# Patient Record
Sex: Female | Born: 1985 | Race: Black or African American | Hispanic: No | Marital: Single | Smoking: Light tobacco smoker
Health system: Southern US, Community
[De-identification: ages and names within clinical notes are randomized; demographics above are authoritative.]

---

## 2012-02-28 ENCOUNTER — Encounter (HOSPITAL_COMMUNITY): Payer: Self-pay | Admitting: Adult Health

## 2012-02-28 ENCOUNTER — Emergency Department (HOSPITAL_COMMUNITY): Payer: Self-pay

## 2012-02-28 ENCOUNTER — Emergency Department (HOSPITAL_COMMUNITY)
Admission: EM | Admit: 2012-02-28 | Discharge: 2012-02-28 | Disposition: A | Discharge status: Home / Self Care | Payer: Self-pay | Attending: Emergency Medicine | Admitting: Emergency Medicine

## 2012-02-28 DIAGNOSIS — J111 Influenza due to unidentified influenza virus with other respiratory manifestations: Secondary | ICD-10-CM | POA: Insufficient documentation

## 2012-02-28 DIAGNOSIS — F17200 Nicotine dependence, unspecified, uncomplicated: Secondary | ICD-10-CM | POA: Insufficient documentation

## 2012-02-28 DIAGNOSIS — R0989 Other specified symptoms and signs involving the circulatory and respiratory systems: Secondary | ICD-10-CM | POA: Insufficient documentation

## 2012-02-28 MED ORDER — OSELTAMIVIR PHOSPHATE 75 MG PO CAPS: 75.0000 mg | ORAL_CAPSULE | Freq: Two times a day (BID) | ORAL | Status: AC

## 2012-02-28 MED ORDER — OSELTAMIVIR PHOSPHATE 75 MG PO CAPS
75.0000 mg | ORAL_CAPSULE | Freq: Once | ORAL | Status: AC
  Administered 2012-02-28: 75 mg via ORAL
  Filled 2012-02-28: qty 1

## 2012-02-28 MED ORDER — SODIUM CHLORIDE 0.9 % IV BOLUS (SEPSIS)
1000.0000 mL | Freq: Once | INTRAVENOUS | Status: AC
  Administered 2012-02-28: 1000 mL via INTRAVENOUS

## 2012-02-28 MED ORDER — IBUPROFEN 400 MG PO TABS
800.0000 mg | ORAL_TABLET | Freq: Once | ORAL | Status: AC
  Administered 2012-02-28: 800 mg via ORAL
  Filled 2012-02-28: qty 2

## 2012-02-28 MED ORDER — ACETAMINOPHEN 325 MG PO TABS
650.0000 mg | ORAL_TABLET | Freq: Once | ORAL | Status: AC
  Administered 2012-02-28: 650 mg via ORAL
  Filled 2012-02-28: qty 2

## 2012-02-28 MED ORDER — ACETAMINOPHEN 325 MG PO TABS: 650.0000 mg | ORAL_TABLET | Freq: Once | ORAL | Status: DC

## 2012-02-28 NOTE — ED Provider Notes (Signed)
Medical screening examination/treatment/procedure(s) were performed by non-physician practitioner and as supervising physician I was immediately available for consultation/collaboration.    Celene Kras, MD 02/28/12 [phone]

## 2012-02-28 NOTE — ED Notes (Signed)
C/o HA onset 12.24 pm sudden onset associated with shaking chills, fever, radiates from top of head  down to posterior neck. Denies  Nausea, vomiting or photophobia.  Rates HA 7/10,constant unreleved by ibuprofen and robitussin.

## 2012-02-28 NOTE — ED Notes (Addendum)
Presents with 24 hours of chills and headache that begins in forehead and radiates to neck. Headache is described as severe and not associated with light and sound sensitivity. Denies SOB. Reports cough with congestion. Fever of 103.0 at triage. Denies rashes denies vomiting.  C/o generalized body aches.

## 2012-02-28 NOTE — ED Provider Notes (Signed)
  Physical Exam  BP 136/77  Pulse 102  Temp 102.3 F (39.1 C) (Oral)  Resp 20  SpO2 98%  LMP 02/28/2012  Physical Exam temperature spikes to 104.5 despite tylenol and Ibuprofen  ED Course  Procedures  MDM Administered first dose of Tamiflu and 1 Liter fluids patient feels better       Arman Filter, NP 02/28/12 2143

## 2012-02-28 NOTE — ED Provider Notes (Signed)
History  This chart was scribed for Gwyneth Sprout, MD by Shari Heritage, ED Scribe. The patient was seen in room TR07C/TR07C. Patient's care was started at 1826.  CSN: [id number]  Arrival date & time 02/28/12  1826   Chief Complaint  Patient presents with  . Headache     The history is provided by the patient. No language interpreter was used.    HPI Comments: Barbara Roberson is a 26 y.o. female who presents to the Emergency Department complaining of constant, moderate to severe, dull headache that radiates to her neck and chills onset yesterday. There is associated mild, infrequent nonproductive cough and chest congestion. Patient also reports some mild right shin pain. Patient denies sore throat, rhinorrhea or shortness of breath. Patient took Ibuprofen and Robitussin 9 hours ago. 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 She lives with her uncle but says he is not having any symptoms. Patient did not have a flu shot this year. She has no significant medical or surgical history.  History reviewed. No pertinent family history.  History  Substance Use Topics  . Smoking status: Light Tobacco Smoker  . Smokeless tobacco: Not on file  . Alcohol Use: No    OB History    Grav Para Term Preterm Abortions TAB SAB Ect Mult Living                  Review of Systems A complete 10 system review of systems was obtained and all systems are negative except as noted in the HPI and PMH.   Allergies  Review of patient's allergies indicates no known allergies.  Home Medications  No current outpatient prescriptions on file.  Triage Vitals: BP 145/73  Pulse 107  Temp 103 F (39.4 C) (Oral)  Resp 18  SpO2 99%  Physical Exam  Constitutional: She is oriented to person, place, and time. She appears well-developed and well-nourished. No distress.  HENT:  Head: Normocephalic and atraumatic.  Right Ear: Tympanic membrane and external ear normal.  Left Ear: Tympanic  membrane and external ear normal.  Mouth/Throat: Oropharynx is clear and moist.  Eyes: EOM are normal.  Neck: Neck supple.  Cardiovascular: Normal rate and regular rhythm.   No murmur heard. Pulmonary/Chest: Effort normal and breath sounds normal. No respiratory distress. She has no wheezes. She has no rales.  Abdominal: She exhibits no distension.  Musculoskeletal: Normal range of motion. She exhibits no edema and no tenderness.  Lymphadenopathy:    She has no cervical adenopathy.  Neurological: She is alert and oriented to person, place, and time.  Skin: Skin is warm and dry. No rash noted.  Psychiatric: She has a normal mood and affect. Her behavior is normal.    ED Course  Procedures (including critical care time) DIAGNOSTIC STUDIES: Oxygen Saturation is 99% on room air, normal by my interpretation.    COORDINATION OF CARE: 6:55 PM- Patient informed of current plan for treatment and evaluation and agrees with plan at this time.      Labs Reviewed - No data to display Dg Chest 2 View  02/28/2012  *RADIOLOGY REPORT*  Clinical Data: Fever and cough  CHEST - 2 VIEW  Comparison: None.  Findings: There is no edema or consolidation.  Heart size and pulmonary vascularity are normal.  No adenopathy.  There is upper thoracic levoscoliosis with mid thoracic dextroscoliosis.  IMPRESSION: Scoliosis.  No edema or consolidation.   Original Report Authenticated By: Bretta Bang, M.D.  No diagnosis found.    MDM   Pt with symptoms consistent with influenza.  Normal exam here but is febrile to 103.  No signs of breathing difficulty  No signs of strep pharyngitis, otitis or abnormal abdominal findings.   CXR wnl. Tamiflu given. Will continue antipyretica and rest and fluids and return for any further problems.     I personally performed the services described in this documentation, which was scribed in my presence.  The recorded information has been reviewed and  considered.   Gwyneth Sprout, MD 02/28/12 1949

## 2014-04-26 IMAGING — CR DG CHEST 2V
2 series · 2 of 2 positions shown · non-contrast
Comparison: None.

CLINICAL DATA: Fever and cough

CHEST - 2 VIEW

[w chest pa]
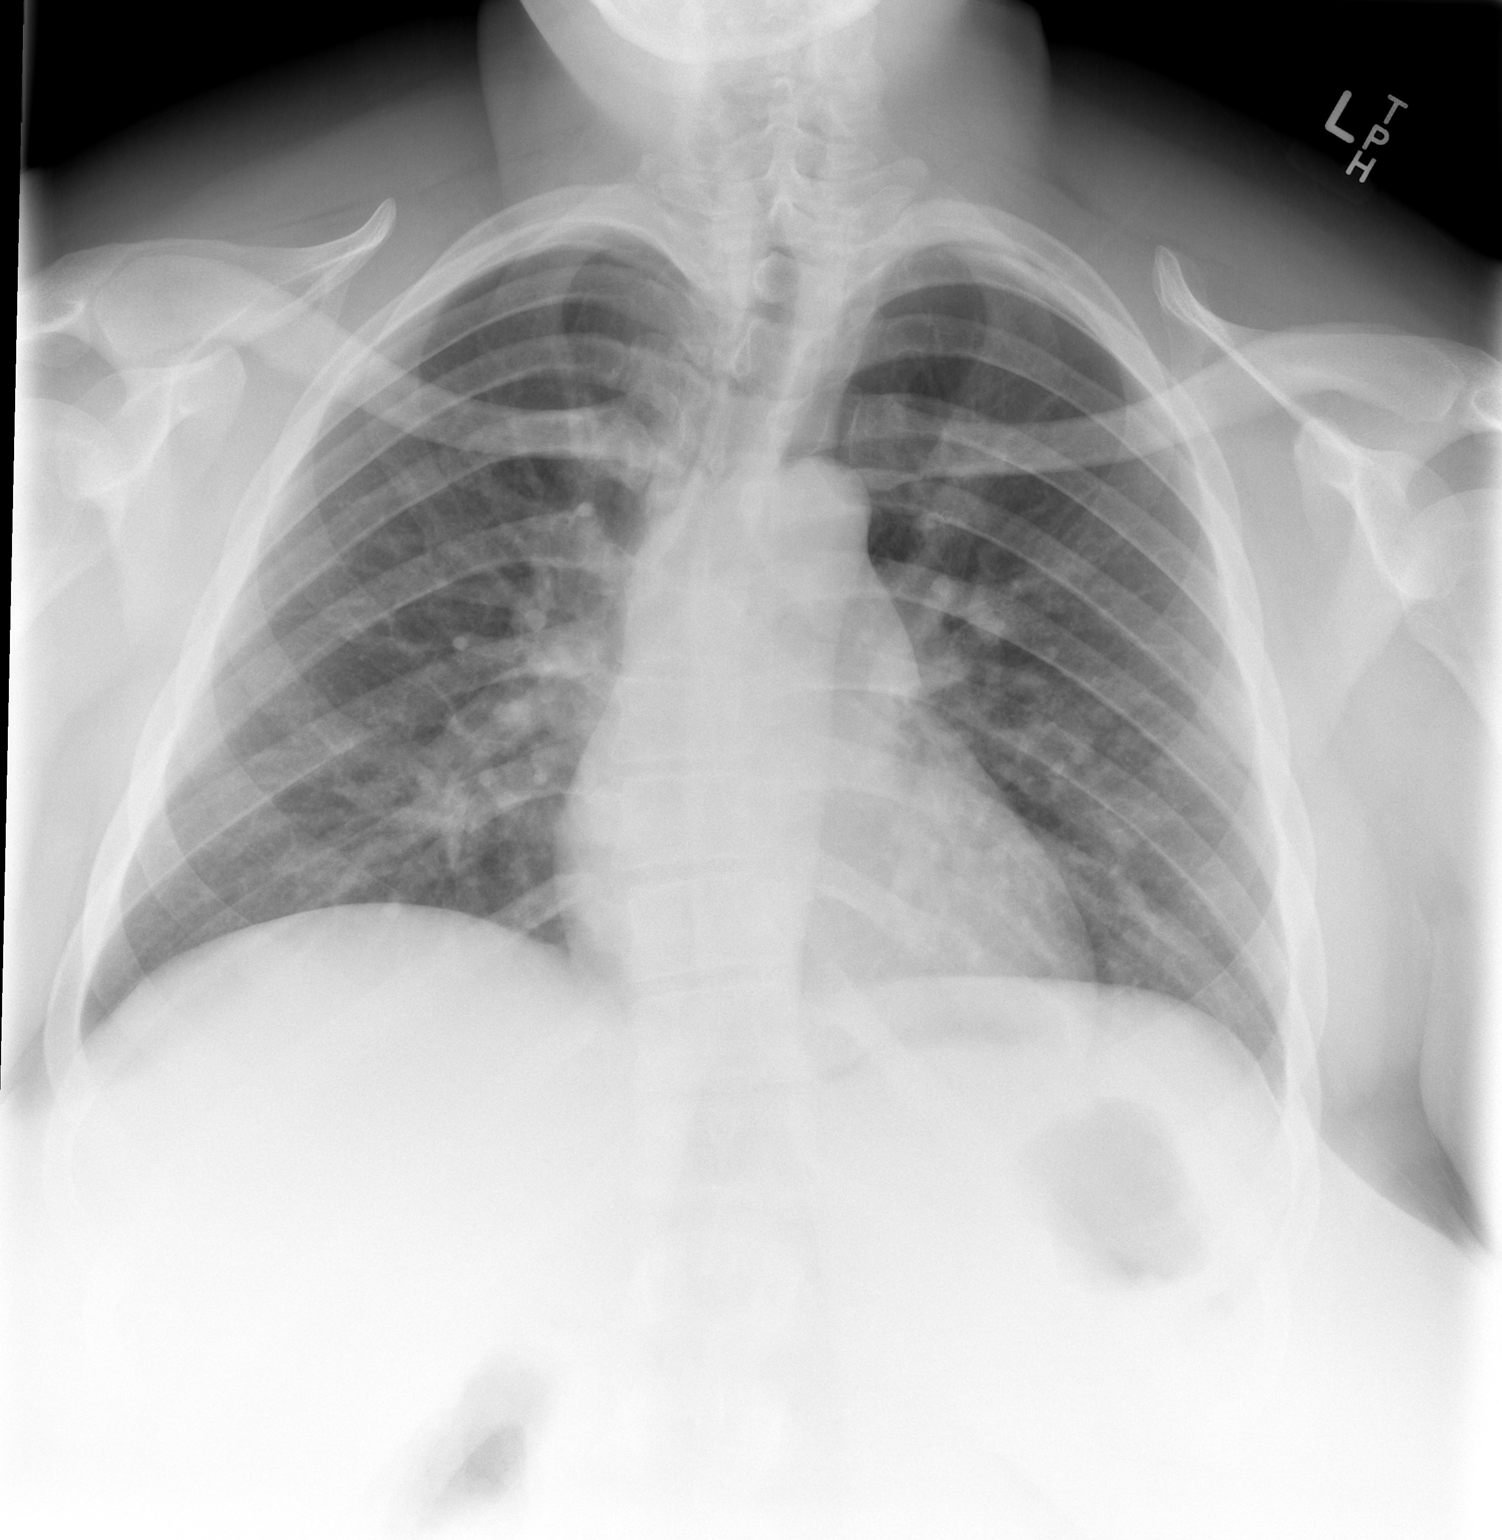

[w chest lat]
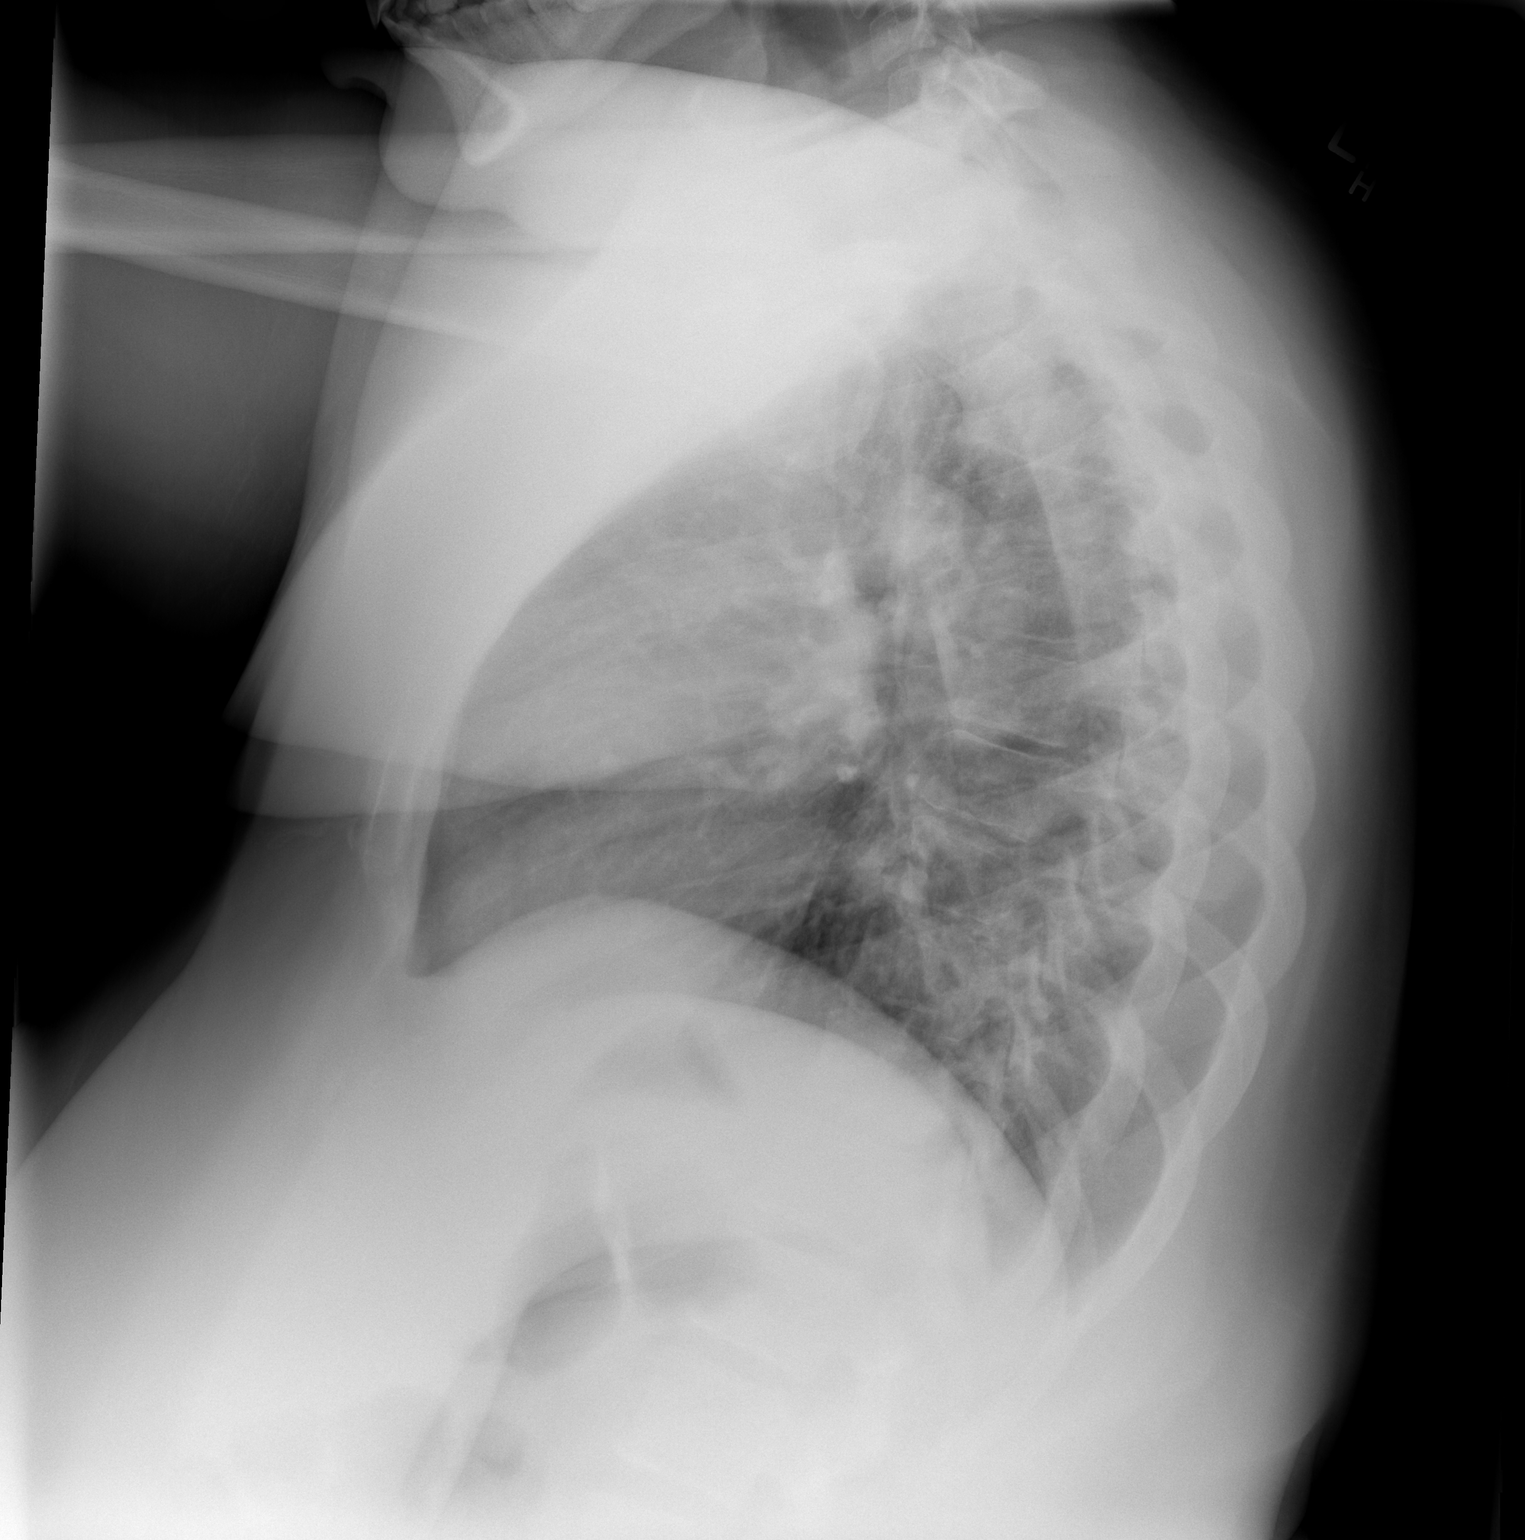

[2 of 2 positions shown; findings below may reference images not displayed]

FINDINGS: There is no edema or consolidation.  Heart size and
pulmonary vascularity are normal.  No adenopathy.  There is upper
thoracic levoscoliosis with mid thoracic dextroscoliosis.
IMPRESSION: Scoliosis.  No edema or consolidation.

## 2015-07-14 ENCOUNTER — Encounter (HOSPITAL_COMMUNITY): Payer: Self-pay | Admitting: *Deleted

## 2015-07-14 ENCOUNTER — Emergency Department (HOSPITAL_COMMUNITY)
Admission: EM | Admit: 2015-07-14 | Discharge: 2015-07-14 | Disposition: A | Discharge status: Home / Self Care | Payer: No Typology Code available for payment source | Attending: Emergency Medicine | Admitting: Emergency Medicine

## 2015-07-14 DIAGNOSIS — Y9389 Activity, other specified: Secondary | ICD-10-CM | POA: Diagnosis not present

## 2015-07-14 DIAGNOSIS — Z79899 Other long term (current) drug therapy: Secondary | ICD-10-CM | POA: Diagnosis not present

## 2015-07-14 DIAGNOSIS — Y998 Other external cause status: Secondary | ICD-10-CM | POA: Insufficient documentation

## 2015-07-14 DIAGNOSIS — S3992XA Unspecified injury of lower back, initial encounter: Secondary | ICD-10-CM | POA: Diagnosis present

## 2015-07-14 DIAGNOSIS — F17200 Nicotine dependence, unspecified, uncomplicated: Secondary | ICD-10-CM | POA: Insufficient documentation

## 2015-07-14 DIAGNOSIS — Y92410 Unspecified street and highway as the place of occurrence of the external cause: Secondary | ICD-10-CM | POA: Diagnosis not present

## 2015-07-14 DIAGNOSIS — M7918 Myalgia, other site: Secondary | ICD-10-CM

## 2015-07-14 MED ORDER — IBUPROFEN 400 MG PO TABS
400.0000 mg | ORAL_TABLET | Freq: Once | ORAL | Status: AC
  Administered 2015-07-14: 400 mg via ORAL
  Filled 2015-07-14: qty 1

## 2015-07-14 MED ORDER — METHOCARBAMOL 500 MG PO TABS: 500.0000 mg | ORAL_TABLET | Freq: Two times a day (BID) | ORAL | Status: AC

## 2015-07-14 MED ORDER — ACETAMINOPHEN 500 MG PO TABS
1000.0000 mg | ORAL_TABLET | Freq: Once | ORAL | Status: AC
Start: 2015-07-14 — End: 2015-07-14
  Administered 2015-07-14: 1000 mg via ORAL
  Filled 2015-07-14: qty 2

## 2015-07-14 NOTE — Discharge Instructions (Signed)
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
